# Patient Record
Sex: Female | Born: 1945 | Race: White | Hispanic: No | Marital: Married | Smoking: Never smoker
Health system: Southern US, Community
[De-identification: ages and names within clinical notes are randomized; demographics above are authoritative.]

## PROBLEM LIST (undated history)

## (undated) HISTORY — PX: BLADDER SURGERY: SHX569

## (undated) HISTORY — PX: DILATION AND CURETTAGE OF UTERUS: SHX78

## (undated) HISTORY — PX: ABDOMINAL HYSTERECTOMY: SHX81

---

## 2010-10-15 ENCOUNTER — Other Ambulatory Visit: Payer: Self-pay | Admitting: Family Medicine

## 2010-10-15 ENCOUNTER — Ambulatory Visit
Admission: RE | Admit: 2010-10-15 | Discharge: 2010-10-15 | Disposition: A | Discharge status: Home / Self Care | Payer: Medicare Other | Source: Ambulatory Visit | Attending: Family Medicine | Admitting: Family Medicine

## 2010-10-15 ENCOUNTER — Encounter: Payer: Self-pay | Admitting: Family Medicine

## 2010-10-15 ENCOUNTER — Inpatient Hospital Stay (INDEPENDENT_AMBULATORY_CARE_PROVIDER_SITE_OTHER)
Admission: RE | Admit: 2010-10-15 | Discharge: 2010-10-15 | Disposition: A | Discharge status: Home / Self Care | Payer: Medicare Other | Source: Ambulatory Visit | Attending: Family Medicine | Admitting: Family Medicine

## 2010-10-15 DIAGNOSIS — M654 Radial styloid tenosynovitis [de Quervain]: Secondary | ICD-10-CM

## 2010-10-15 DIAGNOSIS — M79609 Pain in unspecified limb: Secondary | ICD-10-CM

## 2010-12-06 NOTE — Progress Notes (Signed)
Summary: Can not move right thumb/joint swollen Room 5   Vital Signs:  Patient Profile:   65 Years Old Female CC:      Rt thumb pain since yestreday morning no trauma Height:     66 inches Weight:      214 pounds O2 Sat:      97 % O2 treatment:    Room Air Temp:     97.3 degrees F oral Pulse rate:   72 / minute Pulse rhythm:   regular Resp:     16 per minute BP sitting:   155 / 84  (left arm) Cuff size:   large  Pt. in pain?   yes    Location:   hand    Intensity:   4    Type:       sharp  Vitals Entered By: Emilio Math (October 15, 2010 11:23 AM)                   Current Allergies: No known allergies History of Present Illness Chief Complaint: Rt thumb pain since yestreday morning no trauma History of Present Illness:  Subjective:  Patient complains of awakening yesterday with pain in her right proximal thumb that has persisted.  She recalls no injury.  No recent change in activites, repetitive motion, etc.  Current Meds ESTRACE 1 MG TABS (ESTRADIOL)  ADVIL 200 MG TABS (IBUPROFEN)   REVIEW OF SYSTEMS Constitutional Symptoms      Denies fever, chills, night sweats, weight loss, weight gain, and fatigue.  Eyes       Denies change in vision, eye pain, eye discharge, glasses, contact lenses, and eye surgery. Ear/Nose/Throat/Mouth       Denies hearing loss/aids, change in hearing, ear pain, ear discharge, dizziness, frequent runny nose, frequent nose bleeds, sinus problems, sore throat, hoarseness, and tooth pain or bleeding.  Respiratory       Denies dry cough, productive cough, wheezing, shortness of breath, asthma, bronchitis, and emphysema/COPD.  Cardiovascular       Denies murmurs, chest pain, and tires easily with exhertion.    Gastrointestinal       Denies stomach pain, nausea/vomiting, diarrhea, constipation, blood in bowel movements, and indigestion. Genitourniary       Denies painful urination, kidney stones, and loss of urinary  control. Neurological       Denies paralysis, seizures, and fainting/blackouts. Musculoskeletal       Complains of muscle pain, joint pain, joint stiffness, and decreased range of motion.      Denies redness, swelling, muscle weakness, and gout.  Skin       Denies bruising, unusual mles/lumps or sores, and hair/skin or nail changes.  Psych       Denies mood changes, temper/anger issues, anxiety/stress, speech problems, depression, and sleep problems.  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   Mild tenderness over first MCP joint.  No erythema or warmth. Right thumb:  Decreased range of motion, but no distal tenderness.  Distal neurovascular intact.  X-ray right hand:  IMPRESSION: Negative right hand. Assessment New Problems: DE QUERVAIN'S TENOSYNOVITIS (ICD-727.04) HAND PAIN, RIGHT (ICD-729.5)   Plan New Orders: T-DG Hand Complete*R* [73130]  Wrist/Thumb Spica [id number] New Patient Level III [id number] Planning Comments:   Thumb spica splint applied; wear for 5 to 7 days.  Continue applying ice pack several times daily.  Take Ibuprofen 200mg , 4 tabs every 8 hours with food.  Begin range of motion and stretching exercises in 3 to 5 days ([facility] information and instruction patient handout given)  Followup with [facility] if not improved in two weeks.   The patient and/or caregiver has been counseled thoroughly with regard to medications prescribed including dosage, schedule, interactions, rationale for use, and possible side effects and they verbalize understanding.  Diagnoses and expected course of recovery discussed and  will return if not improved as expected or if the condition worsens. Patient and/or caregiver verbalized understanding.   Orders Added: 1)  T-DG Hand Complete*R* [73130] 2)  Wrist/Thumb Spica [L3923] 3)  New Patient Level III [16109]

## 2013-01-14 IMAGING — CR DG HAND COMPLETE 3+V*R*
2 series · 2 of 2 positions shown · non-contrast
Comparison: None.

CLINICAL DATA: Pain in the right thumb, no trauma

RIGHT HAND - COMPLETE 3+ VIEW

[view not recorded (1 of 2)]
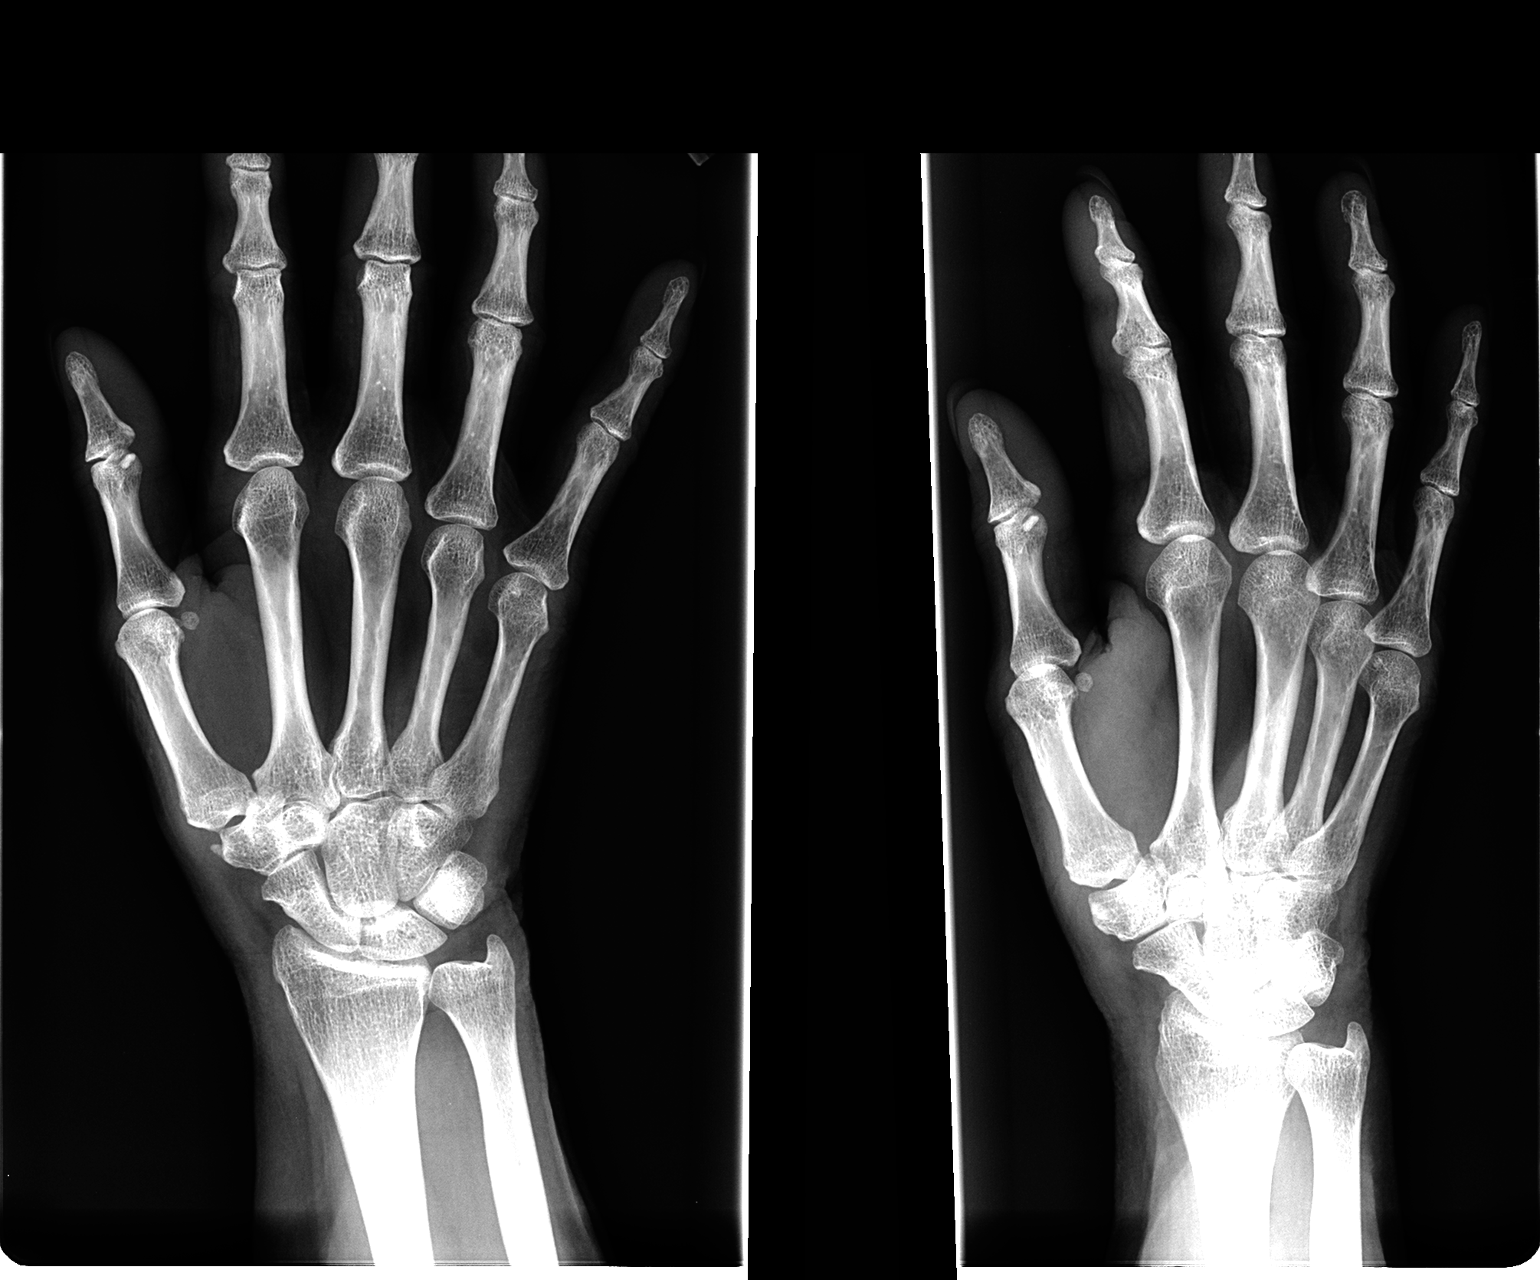

[view not recorded (2 of 2)]
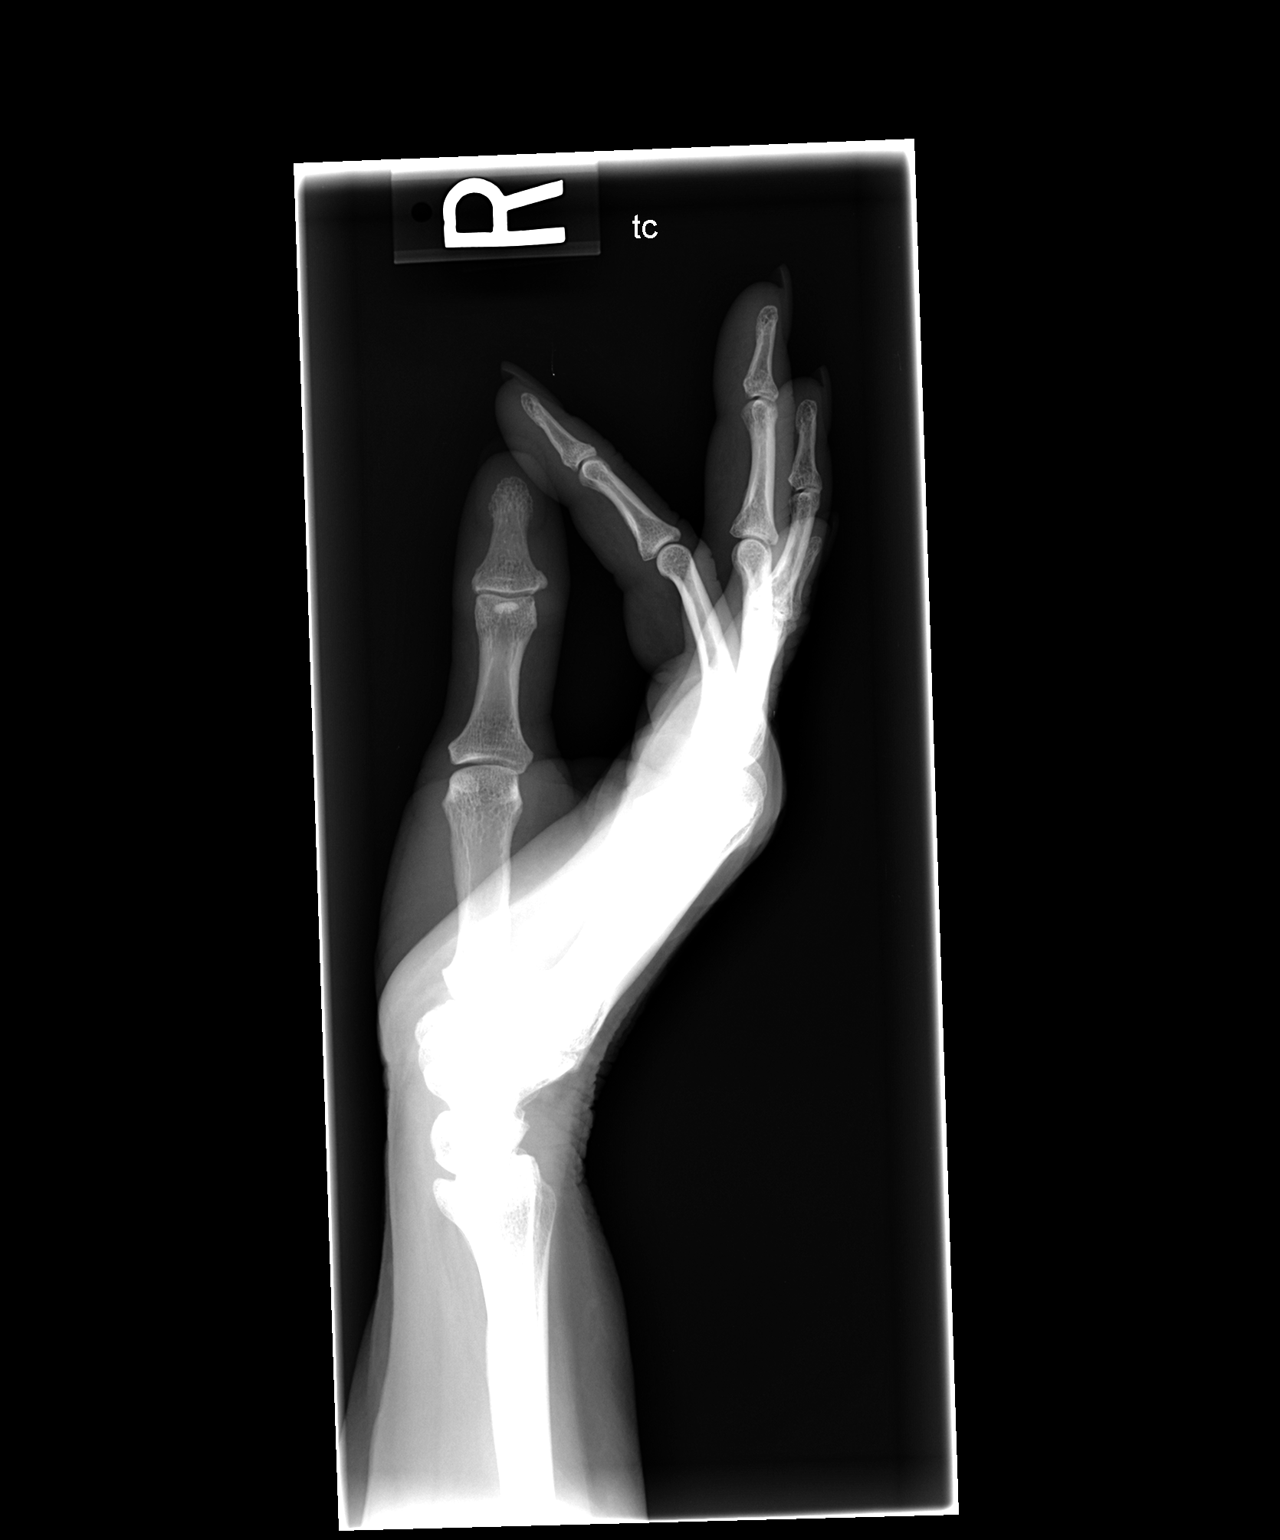

[2 of 2 positions shown; findings below may reference images not displayed]

FINDINGS: No acute fracture is seen.  Alignment is normal.  Joint
spaces appear relatively normal.  A sclerotic focus in the distal
portion of the proximal phalanx of the right thumb is consistent
with a benign process.  The radiocarpal joint space appears normal.
The carpal bones are in normal position.
IMPRESSION: Negative right hand.

## 2013-05-24 ENCOUNTER — Emergency Department (INDEPENDENT_AMBULATORY_CARE_PROVIDER_SITE_OTHER)
Admission: EM | Admit: 2013-05-24 | Discharge: 2013-05-24 | Disposition: A | Discharge status: Home / Self Care | Payer: Medicare Other | Source: Home / Self Care | Attending: Emergency Medicine | Admitting: Emergency Medicine

## 2013-05-24 ENCOUNTER — Encounter: Payer: Self-pay | Admitting: Emergency Medicine

## 2013-05-24 DIAGNOSIS — J040 Acute laryngitis: Secondary | ICD-10-CM

## 2013-05-24 DIAGNOSIS — J029 Acute pharyngitis, unspecified: Secondary | ICD-10-CM

## 2013-05-24 LAB — POCT RAPID STREP A (OFFICE): Rapid Strep A Screen: NEGATIVE

## 2013-05-24 MED ORDER — AZITHROMYCIN 250 MG PO TABS: ORAL_TABLET | ORAL | Status: AC

## 2013-05-24 NOTE — ED Provider Notes (Signed)
CSN: [id number]     Arrival date & time 05/24/13  1550 History   First MD Initiated Contact with Patient 05/24/13 1553     Chief Complaint  Patient presents with  . Sore Throat   (Consider location/radiation/quality/duration/timing/severity/associated sxs/prior Treatment) HPI SORE THROAT Onset: 1 days    Severity: moderate Associated symptoms: Hoarseness Tried OTC meds without significant relief.  Symptoms:  No Fever  + Mild Swollen neck glands No Recent Strep Exposure     No Myalgias No Headache No Rash  No Discolored Nasal Mucus No Allergy symptoms No sinus pain/pressure No itchy/red eyes No earache  No Drooling No Trismus  No Nausea No Vomiting No Abdominal pain No Diarrhea No Reflux symptoms  No Cough No Breathing Difficulty No Shortness of Breath No pleuritic pain No Wheezing No Hemoptysis  She and husband live on [address] [place], visiting her son's family here in [place] History reviewed. No pertinent past medical history. Past Surgical History  Procedure Laterality Date  . Dilation and curettage of uterus    . Abdominal hysterectomy    . Bladder surgery     Family History  Problem Relation Age of Onset  . Cancer Mother     pancreatic/liver   History  Substance Use Topics  . Smoking status: Never Smoker   . Smokeless tobacco: Not on file  . Alcohol Use: Yes   OB History   Grav Para Term Preterm Abortions TAB SAB Ect Mult Living                 Review of Systems  All other systems reviewed and are negative.   Allergies  Review of patient's allergies indicates no known allergies.  Home Medications   Prior to Admission medications   Medication Sig Start Date End Date Taking? Authorizing Provider  Estradiol (ESTRACE PO) Take by mouth.   Yes Historical Provider, MD  azithromycin (ZITHROMAX Z-PAK) 250 MG tablet Take 2 tablets on day one, then 1 tablet daily on days 2 through 5 05/24/13   Lajean Manes, MD   BP 129/69  Pulse  78  Temp(Src) 98.1 F (36.7 C) (Oral)  Resp 18  Ht 5\' 6"  (1.676 m)  Wt 153 lb (69.4 kg)  BMI 24.71 kg/m2  SpO2 98% Physical Exam  Nursing note and vitals reviewed. Constitutional: She is oriented to person, place, and time. She appears well-developed and well-nourished. She is cooperative.  Non-toxic appearance. No distress.  She is hoarse  HENT:  Head: Normocephalic and atraumatic.  Right Ear: Tympanic membrane, external ear and ear canal normal.  Left Ear: Tympanic membrane, external ear and ear canal normal.  Nose: Nose normal. Right sinus exhibits no maxillary sinus tenderness and no frontal sinus tenderness. Left sinus exhibits no maxillary sinus tenderness and no frontal sinus tenderness.  Mouth/Throat: Mucous membranes are normal. Posterior oropharyngeal erythema present. No oropharyngeal exudate or posterior oropharyngeal edema.  Eyes: Conjunctivae are normal. No scleral icterus.  Neck: Neck supple.  Cardiovascular: Normal rate, regular rhythm and normal heart sounds.   No murmur heard. Pulmonary/Chest: Effort normal and breath sounds normal. No stridor. No respiratory distress. She has no wheezes. She has no rales.  Musculoskeletal: She exhibits no edema.  Lymphadenopathy:    She has cervical adenopathy.       Right cervical: Superficial cervical adenopathy present. No deep cervical and no posterior cervical adenopathy present.      Left cervical: Superficial cervical adenopathy present. No deep cervical and no posterior cervical adenopathy present.  Neurological: She is alert and oriented to person, place, and time.  Skin: Skin is warm and dry.  Psychiatric: She has a normal mood and affect.    ED Course  Procedures (including critical care time) Labs Review Labs Reviewed  STREP A DNA PROBE  POCT RAPID STREP A (OFFICE)    Imaging Review No results found.   MDM   1. Acute pharyngitis   2. Acute laryngitis    Rapid strep test negative. I explained this  pharyngitis/laryngitis is likely viral and we discussed OTC symptomatic care. Treatment options discussed, as well as risks, benefits, alternatives. Patient voiced understanding and agreement with the following plans: Sendoff strep culture . Gave her a written prescription for Zithromax Z-Pak, to fill if not improving in a few days or if she spikes a fever or has worsening symptoms. Follow-up with your primary care doctor in 5-7 days if not improving, or sooner if symptoms become worse. Precautions discussed. Red flags discussed. Questions invited and answered. Patient voiced understanding and agreement.     Lajean Manesavid Massey, MD 05/24/13 [phone]

## 2013-05-24 NOTE — ED Notes (Signed)
Pt c/o sore throat x 1 day. Denies fever.

## 2013-05-25 LAB — STREP A DNA PROBE: GASP: NEGATIVE

## 2013-05-28 ENCOUNTER — Telehealth: Payer: Self-pay | Admitting: *Deleted
